# Patient Record
Sex: Male | Born: 1990 | Race: White | Hispanic: No | Marital: Single | Smoking: Never smoker
Health system: Southern US, Community
[De-identification: ages and names within clinical notes are randomized; demographics above are authoritative.]

## PROBLEM LIST (undated history)

## (undated) HISTORY — PX: ANTERIOR CRUCIATE LIGAMENT REPAIR: SHX115

---

## 2016-03-25 ENCOUNTER — Emergency Department: Payer: Self-pay

## 2016-03-25 ENCOUNTER — Emergency Department
Admission: EM | Admit: 2016-03-25 | Discharge: 2016-03-25 | Disposition: A | Discharge status: Home / Self Care | Payer: Self-pay | Attending: Emergency Medicine | Admitting: Emergency Medicine

## 2016-03-25 DIAGNOSIS — Y999 Unspecified external cause status: Secondary | ICD-10-CM | POA: Insufficient documentation

## 2016-03-25 DIAGNOSIS — Y939 Activity, unspecified: Secondary | ICD-10-CM | POA: Insufficient documentation

## 2016-03-25 DIAGNOSIS — W172XXA Fall into hole, initial encounter: Secondary | ICD-10-CM | POA: Insufficient documentation

## 2016-03-25 DIAGNOSIS — Y929 Unspecified place or not applicable: Secondary | ICD-10-CM | POA: Insufficient documentation

## 2016-03-25 DIAGNOSIS — S96912A Strain of unspecified muscle and tendon at ankle and foot level, left foot, initial encounter: Secondary | ICD-10-CM | POA: Insufficient documentation

## 2016-03-25 MED ORDER — NAPROXEN 500 MG PO TABS: 500.0000 mg | ORAL_TABLET | Freq: Two times a day (BID) | ORAL | Status: DC

## 2016-03-25 NOTE — ED Notes (Signed)
Pt states that he stepped in a hole and twisted his left ankle, pt states each time he puts weight on it he has pain in the ankle radiating down into the left foot

## 2016-03-25 NOTE — Discharge Instructions (Signed)
Cryotherapy Cryotherapy is when you put ice on your injury. Ice helps lessen pain and puffiness (swelling) after an injury. Ice works the best when you start using it in the first 24 to 48 hours after an injury. HOME CARE  Put a dry or damp towel between the ice pack and your skin.  You may press gently on the ice pack.  Leave the ice on for no more than 10 to 20 minutes at a time.  Check your skin after 5 minutes to make sure your skin is okay.  Rest at least 20 minutes between ice pack uses.  Stop using ice when your skin loses feeling (numbness).  Do not use ice on someone who cannot tell you when it hurts. This includes small children and people with memory problems (dementia). GET HELP RIGHT AWAY IF:  You have white spots on your skin.  Your skin turns blue or pale.  Your skin feels waxy or hard.  Your puffiness gets worse. MAKE SURE YOU:   Understand these instructions.  Will watch your condition.  Will get help right away if you are not doing well or get worse.   This information is not intended to replace advice given to you by your health care provider. Make sure you discuss any questions you have with your health care provider.   Document Released: 02/07/2008 Document Revised: 11/13/2011 Document Reviewed: 04/13/2011 Elsevier Interactive Patient Education 2016 [employer]  Ankle Sprain An ankle sprain is an injury to the strong, fibrous tissues (ligaments) that hold your ankle bones together.  HOME CARE   Put ice on your ankle for 1-2 days or as told by your doctor.  Put ice in a plastic bag.  Place a towel between your skin and the bag.  Leave the ice on for 15-20 minutes at a time, every 2 hours while you are awake.  Only take medicine as told by your doctor.  Raise (elevate) your injured ankle above the level of your heart as much as possible for 2-3 days.  Use crutches if your doctor tells you to. Slowly put your own weight on the affected  ankle. Use the crutches until you can walk without pain.  If you have a plaster splint:  Do not rest it on anything harder than a pillow for 24 hours.  Do not put weight on it.  Do not get it wet.  Take it off to shower or bathe.  If given, use an elastic wrap or support stocking for support. Take the wrap off if your toes lose feeling (numb), tingle, or turn cold or blue.  If you have an air splint:  Add or let out air to make it comfortable.  Take it off at night and to shower and bathe.  Wiggle your toes and move your ankle up and down often while you are wearing it. GET HELP IF:  You have rapidly increasing bruising or puffiness (swelling).  Your toes feel very cold.  You lose feeling in your foot.  Your medicine does not help your pain. GET HELP RIGHT AWAY IF:   Your toes lose feeling (numb) or turn blue.  You have severe pain that is increasing. MAKE SURE YOU:   Understand these instructions.  Will watch your condition.  Will get help right away if you are not doing well or get worse.   This information is not intended to replace advice given to you by your health care provider. Make sure you discuss any  questions you have with your health care provider.   Document Released: 02/07/2008 Document Revised: 09/11/2014 Document Reviewed: 03/04/2012 Elsevier Interactive Patient Education 2016 Else[employer] Inc.  Adult nurse and RICE WHAT DOES AN ELASTIC BANDAGE DO? Elastic bandages come in different shapes and sizes. They generally provide support to your injury and reduce swelling while you are healing, but they can perform different functions. Your health care provider will help you to decide what is best for your protection, recovery, or rehabilitation following an injury. WHAT ARE SOME GENERAL TIPS FOR USING AN ELASTIC BANDAGE?  Use the bandage as directed by the maker of the bandage that you are using.  Do not wrap the bandage too tightly. This may cut off  the circulation in the arm or leg in the area below the bandage.  If part of your body beyond the bandage becomes blue, numb, cold, swollen, or is more painful, your bandage is most likely too tight. If this occurs, remove your bandage and reapply it more loosely.  See your health care provider if the bandage seems to be making your problems worse rather than better.  An elastic bandage should be removed and reapplied every 3-4 hours or as directed by your health care provider. WHAT IS RICE? The routine care of many injuries includes rest, ice, compression, and elevation (RICE therapy).  Rest Rest is required to allow your body to heal. Generally, you can resume your routine activities when you are comfortable and have been given permission by your health care provider. Ice Icing your injury helps to keep the swelling down and it reduces pain. Do not apply ice directly to your skin.  Put ice in a plastic bag.  Place a towel between your skin and the bag.  Leave the ice on for 20 minutes, 2-3 times per day. Do this for as long as you are directed by your health care provider. Compression Compression helps to keep swelling down, gives support, and helps with discomfort. Compression may be done with an elastic bandage. Elevation Elevation helps to reduce swelling and it decreases pain. If possible, your injured area should be placed at or above the level of your heart or the center of your chest. WHEN SHOULD I SEEK MEDICAL CARE? You should seek medical care if:  You have persistent pain and swelling.  Your symptoms are getting worse rather than improving. These symptoms may indicate that further evaluation or further X-rays are needed. Sometimes, X-rays may not show a small broken bone (fracture) until a number of days later. Make a follow-up appointment with your health care provider. Ask when your X-ray results will be ready. Make sure that you get your X-ray results. WHEN SHOULD I SEEK  IMMEDIATE MEDICAL CARE? You should seek immediate medical care if:  You have a sudden onset of severe pain at or below the area of your injury.  You develop redness or increased swelling around your injury.  You have tingling or numbness at or below the area of your injury that does not improve after you remove the elastic bandage.   This information is not intended to replace advice given to you by your health care provider. Make sure you discuss any questions you have with your health care provider.   Document Released: 02/10/2002 Document Revised: 05/12/2015 Document Reviewed: 04/06/2014 Elsevier Interactive Patient Education 2016 [employer] Inc.   Foot Sprain  A foot sprain is an injury to one of the strong bands of tissue (ligaments) that connect and support  the many bones in your feet. The ligament can be stretched too much or it can tear. A tear can be either partial or complete. The severity of the sprain depends on how much of the ligament was damaged or torn.  CAUSES  A foot sprain is usually caused by suddenly twisting or pivoting your foot.  RISK FACTORS  This injury is more likely to occur in people who:  Play a sport, such as basketball or football.  Exercise or play a sport without warming up.  Start a new workout or sport.  Suddenly increase how long or hard they exercise or play a sport. SYMPTOMS  Symptoms of this condition start soon after an injury and include:  Pain, especially in the arch of the foot.  Bruising.  Swelling.  Inability to walk or use the foot to support body weight. DIAGNOSIS  This condition is diagnosed with a medical history and physical exam. You may also have imaging tests, such as:  X-rays to make sure there are no broken bones (fractures).  MRI to see if the ligament has torn. TREATMENT  Treatment varies depending on the severity of your sprain. Mild sprains can be treated with rest, ice, compression, and elevation (RICE). If your ligament  is overstretched or partially torn, treatment usually involves keeping your foot in a fixed position (immobilization) for a period of time. To help you do this, your health care provider will apply a bandage, splint, or walking boot to keep your foot from moving until it heals. You may also be advised to use crutches or a scooter for a few weeks to avoid bearing weight on your foot while it is healing.  If your ligament is fully torn, you may need surgery to reconnect the ligament to the bone. After surgery, a cast or splint will be applied and will need to stay on your foot while it heals.  Your health care provider may also suggest exercises or physical therapy to strengthen your foot.  HOME CARE INSTRUCTIONS  If You Have a Bandage, Splint, or Walking Boot:  Wear it as directed by your health care provider. Remove it only as directed by your health care provider.  Loosen the bandage, splint, or walking boot if your toes become numb and tingle, or if they turn cold and blue. Bathing  If your health care provider approves bathing and showering, cover the bandage or splint with a watertight plastic bag to protect it from water. Do not let the bandage or splint get wet. Managing Pain, Stiffness, and Swelling  If directed, apply ice to the injured area:  Put ice in a plastic bag.  Place a towel between your skin and the bag.  Leave the ice on for 20 minutes, 2-3 times per day. Move your toes often to avoid stiffness and to lessen swelling.  Raise (elevate) the injured area above the level of your heart while you are sitting or lying down. Driving  Do not drive or operate heavy machinery while taking pain medicine.  Do not drive while wearing a bandage, splint, or walking boot on a foot that you use for driving. Activity  Rest as directed by your health care provider.  Do not use the injured foot to support your body weight until your health care provider says that you can. Use crutches or other  supportive devices as directed by your health care provider.  Ask your health care provider what activities are safe for you. Gradually increase how much  and how far you walk until your health care provider says it is safe to return to full activity.  Do any exercise or physical therapy as directed by your health care provider. General Instructions  If a splint was applied, do not put pressure on any part of it until it is fully hardened. This may take several hours.  Take medicines only as directed by your health care provider. These include over-the-counter medicines and prescription medicines.  Keep all follow-up visits as directed by your health care provider. This is important.  When you can walk without pain, wear supportive shoes that have stiff soles. Do not wear flip-flops, and do not walk barefoot. SEEK MEDICAL CARE IF:  Your pain is not controlled with medicine.  Your bruising or swelling gets worse or does not get better with treatment.  Your splint or walking boot is damaged. SEEK IMMEDIATE MEDICAL CARE IF:  Your foot is numb or blue.  Your foot feels colder than normal. This information is not intended to replace advice given to you by your health care provider. Make sure you discuss any questions you have with your health care provider.  Document Released: 02/10/2002 Document Revised: 01/05/2015 Document Reviewed: 06/24/2014  Elsevier Interactive Patient Education [employer]

## 2016-03-25 NOTE — ED Provider Notes (Signed)
[facility] Emergency Department Provider Note  ____________________________________________  Time seen: Approximately 5:43 PM  I have reviewed the triage vital signs and the nursing notes.   HISTORY  Chief Complaint Ankle Pain    HPI RUSELL MENEELY is a 25 y.o. male , NAD, presents to the emergency department with several hour history of left ankle and foot pain. States he stepped in a hole and twisted the left ankle and foot. Notes that when he bears weight has pain in the ankle radiating into the left foot. Denies any open wounds or lacerations. Does have a swollen area about the left foot. Denies any numbness, weakness, tingling.   No past medical history on file.  There are no active problems to display for this patient.   No past surgical history on file.  Current Outpatient Rx  Name  Route  Sig  Dispense  Refill  . naproxen (NAPROSYN) 500 MG tablet   Oral   Take 1 tablet (500 mg total) by mouth 2 (two) times daily with a meal.   14 tablet   0     Allergies Review of patient's allergies indicates no known allergies.  No family history on file.  Social History Social History  Substance Use Topics  . Smoking status: Not on file  . Smokeless tobacco: Not on file  . Alcohol Use: Not on file     Review of Systems  Constitutional: No fatigue Cardiovascular: No chest pain. Respiratory:  No shortness of breath. Musculoskeletal: Positive left foot and ankle pain.  Skin: Positive swelling left foot. Negative for rash. Neurological: Negative for headaches, focal weakness or numbness. No tingling. 10-point ROS otherwise negative.  ____________________________________________   PHYSICAL EXAM:  VITAL SIGNS: ED Triage Vitals  Enc Vitals Group     BP 03/25/16 1644 133/91 mmHg     Pulse Rate 03/25/16 1644 75     Resp 03/25/16 1644 18     Temp 03/25/16 1644 98.7 F (37.1 C)     Temp Source 03/25/16 1644 Oral     SpO2 03/25/16 1644  99 %     Weight 03/25/16 1644 184 lb (83.462 kg)     Height 03/25/16 1644  (1.778 m)     Head Cir --      Peak Flow --      Pain Score 03/25/16 1645 8     Pain Loc --      Pain Edu? --      Excl. in GC? --      Constitutional: Alert and oriented. Well appearing and in no acute distress. Eyes: Conjunctivae are normal.  Head: Atraumatic. Cardiovascular:  Good peripheral circulation with 2+ pulses noted in the left lower extremity. Capillary refill is brisk in all digits of the left foot. Respiratory: Normal respiratory effort without tachypnea or retractions.  Musculoskeletal: Full range of motion of the left ankle without pain. No anterior or posterior drawer laxity. No varus or valgus stress laxity. No pain with eversion or inversion. No lower extremity tenderness nor edema.  No joint effusions. Neurologic:  Normal speech and language. No gross focal neurologic deficits are appreciated.  Skin:  3 cm oblong area of soft tissue swelling noted about the anterior ankle/proximal fifth metatarsal region. Mild tenderness to palpation. No fluctuant masses noted below. No open wounds or lacerations. No significant ecchymosis. Skin is warm, dry and intact. No rash noted. Psychiatric: Mood and affect are normal. Speech and behavior are normal. Patient exhibits appropriate  insight and judgement.   ____________________________________________   LABS  None ____________________________________________  EKG  None ____________________________________________  RADIOLOGY I have personally viewed and evaluated these images (plain radiographs) as part of my medical decision making, as well as reviewing the written report by the radiologist.  Dg Ankle Complete Left  03/25/2016  CLINICAL DATA:  Twisting injury left ankle due to stepping in a hole today. Pain. Initial encounter. EXAM: LEFT ANKLE COMPLETE - 3+ VIEW COMPARISON:  None. FINDINGS: There is no evidence of fracture, dislocation, or  joint effusion. There is no evidence of arthropathy or other focal bone abnormality. Soft tissues are unremarkable. IMPRESSION: Negative exam. Electronically Signed   By: Drusilla Kanner M.D.   On: 03/25/2016 17:15    ____________________________________________    PROCEDURES  Procedure(s) performed: None   Medications - No data to display   ____________________________________________   INITIAL IMPRESSION / ASSESSMENT AND PLAN / ED COURSE  Pertinent imaging results that were available during my care of the patient were reviewed by me and considered in my medical decision making (see chart for details).  Patient's diagnosis is consistent with strain of left foot. Patient was placed in Ace wrap and given crutches for supportive care. Patient will be discharged home with prescriptions for naproxen to take as directed. Should apply ice to the affected area 20 minutes 3-4 times daily as needed. May keep left lower extremity elevated when not ambulating to decrease swelling. Patient is to follow up with Dr. Martha Clan in orthopedics or his primary care provider if symptoms persist past this treatment course. Patient is given ED precautions to return to the ED for any worsening or new symptoms.      ____________________________________________  FINAL CLINICAL IMPRESSION(S) / ED DIAGNOSES  Final diagnoses:  Strain of left foot, initial encounter      NEW MEDICATIONS STARTED DURING THIS VISIT:  New Prescriptions   NAPROXEN (NAPROSYN) 500 MG TABLET    Take 1 tablet (500 mg total) by mouth 2 (two) times daily with a meal.         Hope Pigeon, PA-C 03/25/16 1805  Governor Rooks, MD 03/25/16 1930

## 2019-01-08 ENCOUNTER — Other Ambulatory Visit: Payer: Self-pay

## 2019-01-08 ENCOUNTER — Ambulatory Visit
Admission: EM | Admit: 2019-01-08 | Discharge: 2019-01-08 | Disposition: A | Discharge status: Home / Self Care | Payer: BLUE CROSS/BLUE SHIELD | Attending: Family Medicine | Admitting: Family Medicine

## 2019-01-08 ENCOUNTER — Encounter: Payer: Self-pay | Admitting: Emergency Medicine

## 2019-01-08 DIAGNOSIS — R1031 Right lower quadrant pain: Secondary | ICD-10-CM

## 2019-01-08 LAB — URINALYSIS, COMPLETE (UACMP) WITH MICROSCOPIC
Bacteria, UA: NONE SEEN
Bilirubin Urine: NEGATIVE
Glucose, UA: NEGATIVE mg/dL
Hgb urine dipstick: NEGATIVE
Ketones, ur: NEGATIVE mg/dL
Leukocytes,Ua: NEGATIVE
Nitrite: NEGATIVE
Protein, ur: NEGATIVE mg/dL
Specific Gravity, Urine: 1.02 (ref 1.005–1.030)
pH: 7 (ref 5.0–8.0)

## 2019-01-08 LAB — COMPREHENSIVE METABOLIC PANEL
ALT: 44 U/L (ref 0–44)
AST: 29 U/L (ref 15–41)
Albumin: 5 g/dL (ref 3.5–5.0)
Alkaline Phosphatase: 61 U/L (ref 38–126)
Anion gap: 9 (ref 5–15)
BUN: 14 mg/dL (ref 6–20)
CO2: 22 mmol/L (ref 22–32)
Calcium: 9 mg/dL (ref 8.9–10.3)
Chloride: 104 mmol/L (ref 98–111)
Creatinine, Ser: 1.04 mg/dL (ref 0.61–1.24)
GFR calc Af Amer: >60 mL/min (ref >60)
GFR calc non Af Amer: >60 mL/min (ref >60)
Glucose, Bld: 106 mg/dL — ABNORMAL HIGH (ref 70–99)
Potassium: 3.9 mmol/L (ref 3.5–5.1)
Sodium: 135 mmol/L (ref 135–145)
Total Bilirubin: 0.8 mg/dL (ref 0.3–1.2)
Total Protein: 7.9 g/dL (ref 6.5–8.1)

## 2019-01-08 LAB — CBC WITH DIFFERENTIAL/PLATELET
Abs Immature Granulocytes: 0.02 10*3/uL (ref 0.00–0.07)
Basophils Absolute: 0.1 10*3/uL (ref 0.0–0.1)
Basophils Relative: 1 %
Eosinophils Absolute: 0.3 10*3/uL (ref 0.0–0.5)
Eosinophils Relative: 6 %
HCT: 46.3 % (ref 39.0–52.0)
Hemoglobin: 16.3 g/dL (ref 13.0–17.0)
Immature Granulocytes: 0 %
Lymphocytes Relative: 23 %
Lymphs Abs: 1.2 10*3/uL (ref 0.7–4.0)
MCH: 31.8 pg (ref 26.0–34.0)
MCHC: 35.2 g/dL (ref 30.0–36.0)
MCV: 90.3 fL (ref 80.0–100.0)
Monocytes Absolute: 0.5 10*3/uL (ref 0.1–1.0)
Monocytes Relative: 9 %
Neutro Abs: 3.1 10*3/uL (ref 1.7–7.7)
Neutrophils Relative %: 61 %
Platelets: 246 10*3/uL (ref 150–400)
RBC: 5.13 MIL/uL (ref 4.22–5.81)
RDW: 12.4 % (ref 11.5–15.5)
WBC: 5.1 10*3/uL (ref 4.0–10.5)
nRBC: 0 % (ref 0.0–0.2)

## 2019-01-08 LAB — LIPASE, BLOOD: Lipase: 28 U/L (ref 11–51)

## 2019-01-08 MED ORDER — DICYCLOMINE HCL 20 MG PO TABS: 20.0000 mg | ORAL_TABLET | Freq: Four times a day (QID) | ORAL | 0 refills | Status: DC | PRN

## 2019-01-08 NOTE — Discharge Instructions (Addendum)
Labs normal.  Medication as directed.  If persists, I recommend CT imaging.  Take care  Dr. Adriana Simas

## 2019-01-08 NOTE — ED Provider Notes (Signed)
[facility]    CSN: [id number] Arrival date & time: 01/08/19  0855  History   Chief Complaint Chief Complaint  Patient presents with  . Abdominal Pain    HPI  28 year old male presents with abdominal pain.  Patient states that his symptoms started on Monday.  Patient reports that he developed gas and diarrhea.  Developed abdominal pain on Thursday.  Seen in a local urgent care on Friday.  Was advised to take Imodium and use Gas-X. Patient states that his gas and diarrhea is now resolved.  However, he continues to have abdominal pain.  He reports right lower quadrant pain.  Described as dull.  Currently 2/10 in severity.  Has not worsened or improved since Thursday.  No fever.  Denies constipation.  No other symptoms.  Patient states that he is eating normally.  Patient states that he drinks alcohol.  Had 3 beers last night.  No other associated symptoms.  No known exacerbating factors.  No other complaints.  History reviewed and updated as below. PMH: ACL tear  Past Surgical History:  Procedure Laterality Date  . ANTERIOR CRUCIATE LIGAMENT REPAIR     Home Medications    Prior to Admission medications   Medication Sig Start Date End Date Taking? Authorizing Provider  dicyclomine (BENTYL) 20 MG tablet Take 1 tablet (20 mg total) by mouth 4 (four) times daily as needed (Abdominal pain). 01/08/19   Tommie Sams, DO   Social History Social History   Tobacco Use  . Smoking status: Never Smoker  . Smokeless tobacco: Never Used  Substance Use Topics  . Alcohol use: Yes  . Drug use: Never     Allergies   Patient has no known allergies.   Review of Systems Review of Systems  Constitutional: Negative for fever.  Gastrointestinal: Positive for abdominal pain and diarrhea. Negative for constipation and vomiting.   Physical Exam Triage Vital Signs ED Triage Vitals  Enc Vitals Group     BP 01/08/19 0911 (!) 153/77     Pulse Rate 01/08/19 0911 80     Resp 01/08/19  0911 18     Temp 01/08/19 0911 98.4 F (36.9 C)     Temp Source 01/08/19 0911 Oral     SpO2 01/08/19 0911 100 %     Weight 01/08/19 0912 195 lb (88.5 kg)     Height 01/08/19 0912 5\' 9"  (1.753 m)     Head Circumference --      Peak Flow --      Pain Score 01/08/19 0912 2     Pain Loc --      Pain Edu? --      Excl. in GC? --    Updated Vital Signs BP (!) 153/77 (BP Location: Right Arm)   Pulse 80   Temp 98.4 F (36.9 C) (Oral)   Resp 18   Ht 5\' 9"  (1.753 m)   Wt 88.5 kg   SpO2 100%   BMI 28.80 kg/m   Visual Acuity Right Eye Distance:   Left Eye Distance:   Bilateral Distance:    Right Eye Near:   Left Eye Near:    Bilateral Near:     Physical Exam Vitals signs and nursing note reviewed.  Constitutional:      General: He is not in acute distress.    Appearance: Normal appearance.  HENT:     Head: Normocephalic and atraumatic.  Eyes:     General: No scleral icterus.  Right eye: No discharge.        Left eye: No discharge.     Conjunctiva/sclera: Conjunctivae normal.  Cardiovascular:     Rate and Rhythm: Normal rate and regular rhythm.  Pulmonary:     Effort: Pulmonary effort is normal.     Breath sounds: Normal breath sounds. No wheezing or rales.  Abdominal:     General: There is no distension.     Palpations: Abdomen is soft.     Tenderness: There is no abdominal tenderness.  Neurological:     Mental Status: He is alert.  Psychiatric:        Mood and Affect: Mood normal.        Behavior: Behavior normal.    UC Treatments / Results  Labs (all labs ordered are listed, but only abnormal results are displayed) Labs Reviewed  URINALYSIS, COMPLETE (UACMP) WITH MICROSCOPIC - Abnormal; Notable for the following components:      Result Value   Color, Urine AMBER (*)    All other components within normal limits  COMPREHENSIVE METABOLIC PANEL - Abnormal; Notable for the following components:   Glucose, Bld 106 (*)    All other components within normal  limits  CBC WITH DIFFERENTIAL/PLATELET  LIPASE, BLOOD    EKG None  Radiology No results found.  Procedures Procedures (including critical care time)  Medications Ordered in UC Medications - No data to display  Initial Impression / Assessment and Plan / UC Course  I have reviewed the triage vital signs and the nursing notes.  Pertinent labs & imaging results that were available during my care of the patient were reviewed by me and considered in my medical decision making (see chart for details).    28 year old male presents with right lower quadrant pain.  His exam is benign.  He had no discrete tenderness on exam.  I do not clinically suspect appendicitis given his clinical picture and exam.  Laboratory studies reassuring.  Dicyclomine as directed.  If persists, will need further work-up with imaging.  Final Clinical Impressions(s) / UC Diagnoses   Final diagnoses:  RLQ abdominal pain     Discharge Instructions     Labs normal.  Medication as directed.  If persists, I recommend CT imaging.  Take care  Dr. Adriana Simasook     ED Prescriptions    Medication Sig Dispense Auth. Provider   dicyclomine (BENTYL) 20 MG tablet Take 1 tablet (20 mg total) by mouth 4 (four) times daily as needed (Abdominal pain). 30 tablet Tommie Samsook, Geoge Lawrance G, DO     Controlled Substance Prescriptions [place] Controlled Substance Registry consulted? Not Applicable   Tommie SamsCook, Abanoub Hanken G, DO 01/08/19 1010

## 2019-01-08 NOTE — ED Triage Notes (Signed)
Patient c/o RLQ abd pain and diarrhea that started 6 days ago. He was seen at an urgent care last week and was given instructions for OTC Imodium and gas x for his symptoms. Diarrhea has resolved but he continues to have the abdominal pain.

## 2020-12-30 ENCOUNTER — Other Ambulatory Visit: Payer: Self-pay

## 2020-12-30 ENCOUNTER — Ambulatory Visit
Admission: RE | Admit: 2020-12-30 | Discharge: 2020-12-30 | Disposition: A | Discharge status: Home / Self Care | Payer: BC Managed Care – PPO | Source: Ambulatory Visit | Attending: Family Medicine | Admitting: Family Medicine

## 2020-12-30 VITALS — BP 133/73 | HR 69 | Temp 98.4°F | Resp 18 | Ht 69.0 in | Wt 195.1 lb

## 2020-12-30 DIAGNOSIS — R519 Headache, unspecified: Secondary | ICD-10-CM | POA: Diagnosis not present

## 2020-12-30 MED ORDER — CETIRIZINE-PSEUDOEPHEDRINE ER 5-120 MG PO TB12: 1.0000 | ORAL_TABLET | Freq: Two times a day (BID) | ORAL | 0 refills | Status: AC

## 2020-12-30 MED ORDER — BUTALBITAL-APAP-CAFFEINE 50-325-40 MG PO TABS: 1.0000 | ORAL_TABLET | Freq: Four times a day (QID) | ORAL | 0 refills | Status: AC | PRN

## 2020-12-30 NOTE — Discharge Instructions (Signed)
Medication as prescribed.  If persists, recommend that you see Neurology.  Take care  Dr. Adriana Simas

## 2020-12-30 NOTE — ED Provider Notes (Signed)
[facility]    CSN: [id number] Arrival date & time: 12/30/20  1242      History   Chief Complaint Chief Complaint  Patient presents with  . Headache   HPI  30 year old male presents with headache.  Started on Monday.  Located behind the left eye.  He denies respiratory symptoms.  No nausea or vomiting.  No photophobia.  No phonophobia.  He reports some mild blurry vision at times in his left eye.  No vision loss.  Pain is 4/10 in severity.  Has not responded to ibuprofen.  He states that he does have some seasonal allergies.  No other reported symptoms.  No other complaints.   Past Surgical History:  Procedure Laterality Date  . ANTERIOR CRUCIATE LIGAMENT REPAIR     Home Medications    Prior to Admission medications   Medication Sig Start Date End Date Taking? Authorizing Provider  butalbital-acetaminophen-caffeine (FIORICET) 50-325-40 MG tablet Take 1 tablet by mouth every 6 (six) hours as needed for headache. 12/30/20 12/30/21 Yes Curlee Bogan G, DO  cetirizine-pseudoephedrine (ZYRTEC-D) 5-120 MG tablet Take 1 tablet by mouth 2 (two) times daily. 12/30/20  Yes Courtland Reas G, DO  dicyclomine (BENTYL) 20 MG tablet Take 1 tablet (20 mg total) by mouth 4 (four) times daily as needed (Abdominal pain). 01/08/19 12/30/20  Tommie Sams, DO   Social History Social History   Tobacco Use  . Smoking status: Never Smoker  . Smokeless tobacco: Never Used  Vaping Use  . Vaping Use: Never used  Substance Use Topics  . Alcohol use: Yes  . Drug use: Never     Allergies   Patient has no known allergies.   Review of Systems Review of Systems  Constitutional: Negative.   Neurological: Positive for headaches.   Physical Exam Triage Vital Signs ED Triage Vitals  Enc Vitals Group     BP 12/30/20 1311 (!) 163/87     Pulse Rate 12/30/20 1311 69     Resp 12/30/20 1311 18     Temp 12/30/20 1311 98.4 F (36.9 C)     Temp Source 12/30/20 1311 Oral     SpO2 12/30/20 1311  100 %     Weight 12/30/20 1308 195 lb 1.7 oz (88.5 kg)     Height 12/30/20 1308 5\' 9"  (1.753 m)     Head Circumference --      Peak Flow --      Pain Score 12/30/20 1308 4     Pain Loc --      Pain Edu? --      Excl. in GC? --    Updated Vital Signs BP 133/73 (BP Location: Right Arm)   Pulse 69   Temp 98.4 F (36.9 C) (Oral)   Resp 18   Ht 5\' 9"  (1.753 m)   Wt 88.5 kg   SpO2 100%   BMI 28.81 kg/m   Visual Acuity Right Eye Distance:   Left Eye Distance:   Bilateral Distance:    Right Eye Near:   Left Eye Near:    Bilateral Near:     Physical Exam Vitals and nursing note reviewed.  Constitutional:      General: He is not in acute distress.    Appearance: Normal appearance. He is not ill-appearing.  HENT:     Head: Normocephalic and atraumatic.  Eyes:     Conjunctiva/sclera: Conjunctivae normal.     Pupils: Pupils are equal, round, and reactive to light.  Cardiovascular:  Rate and Rhythm: Normal rate and regular rhythm.     Heart sounds: No murmur heard.   Pulmonary:     Effort: Pulmonary effort is normal.     Breath sounds: Normal breath sounds. No wheezing, rhonchi or rales.  Neurological:     Mental Status: He is alert.  Psychiatric:        Mood and Affect: Mood normal.        Behavior: Behavior normal.    UC Treatments / Results  Labs (all labs ordered are listed, but only abnormal results are displayed) Labs Reviewed - No data to display  EKG   Radiology No results found.  Procedures Procedures (including critical care time)  Medications Ordered in UC Medications - No data to display  Initial Impression / Assessment and Plan / UC Course  I have reviewed the triage vital signs and the nursing notes.  Pertinent labs & imaging results that were available during my care of the patient were reviewed by me and considered in my medical decision making (see chart for details).    30 year old male presents with acute headache.  Etiology and  prognosis unclear at this time.  History not consistent with migraine, tension, cluster headache.  Could be sinus related although he has essentially no respiratory symptoms at this time.  Placing on Fioricet.  Zyrtec-D as prescribed.  If persist recommend seeing neurology.  Final Clinical Impressions(s) / UC Diagnoses   Final diagnoses:  Acute intractable headache, unspecified headache type     Discharge Instructions     Medication as prescribed.  If persists, recommend that you see Neurology.  Take care  Dr. Adriana Simas   ED Prescriptions    Medication Sig Dispense Auth. Provider   butalbital-acetaminophen-caffeine (FIORICET) 50-325-40 MG tablet Take 1 tablet by mouth every 6 (six) hours as needed for headache. 30 tablet Jupiter Kabir G, DO   cetirizine-pseudoephedrine (ZYRTEC-D) 5-120 MG tablet Take 1 tablet by mouth 2 (two) times daily. 30 tablet Tommie Sams, DO     PDMP not reviewed this encounter.   Tommie Sams, [place] 12/30/20 1523

## 2020-12-30 NOTE — ED Triage Notes (Signed)
Patient c/o headache behind his left eye since Monday. He states the pain gets worse when he applies pressure to the top of his head. Reports some mild blurred vision.

## 2021-01-04 ENCOUNTER — Other Ambulatory Visit: Payer: Self-pay | Admitting: Family Medicine

## 2021-01-04 DIAGNOSIS — R2232 Localized swelling, mass and lump, left upper limb: Secondary | ICD-10-CM

## 2021-02-09 ENCOUNTER — Other Ambulatory Visit: Payer: Self-pay

## 2021-02-09 ENCOUNTER — Ambulatory Visit
Admission: RE | Admit: 2021-02-09 | Discharge: 2021-02-09 | Disposition: A | Discharge status: Home / Self Care | Payer: BC Managed Care – PPO | Source: Ambulatory Visit | Attending: Family Medicine | Admitting: Family Medicine

## 2021-02-09 DIAGNOSIS — R2232 Localized swelling, mass and lump, left upper limb: Secondary | ICD-10-CM | POA: Diagnosis not present

## 2022-04-19 IMAGING — US US EXTREM UP*L* LTD
1 series · 8 of 8 positions shown · non-contrast
Comparison: None.

CLINICAL DATA: Posterior shoulder lump.

EXAM:
ULTRASOUND LEFT UPPER EXTREMITY LIMITED
TECHNIQUE: Ultrasound examination of the upper extremity soft tissues was
performed in the area of clinical concern.

[Series 1: us extrem up*left* ltd · 0.07mm/px · 8 of 8 slices shown]
[im 1/8]
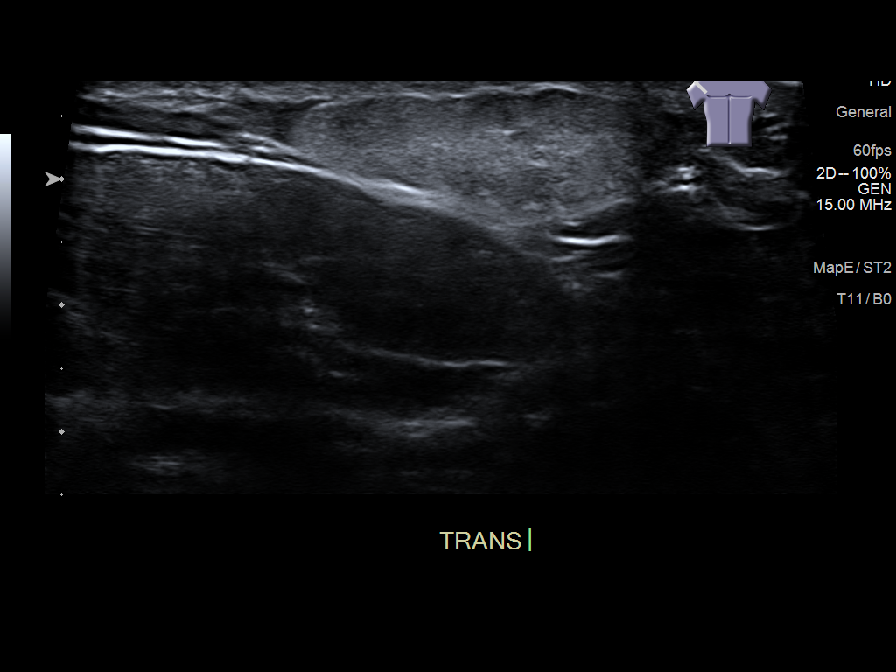
[im 2/8]
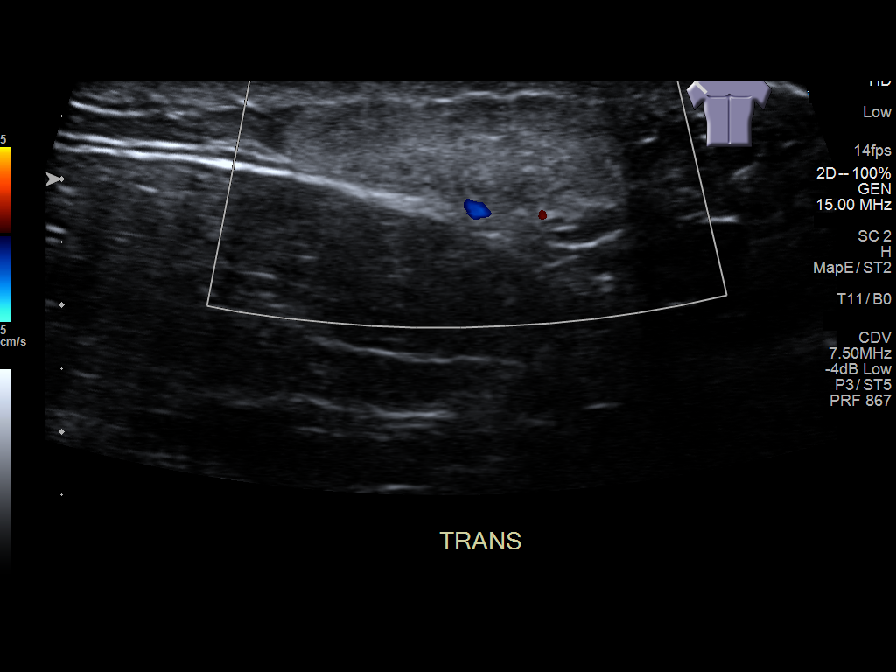
[im 3/8]
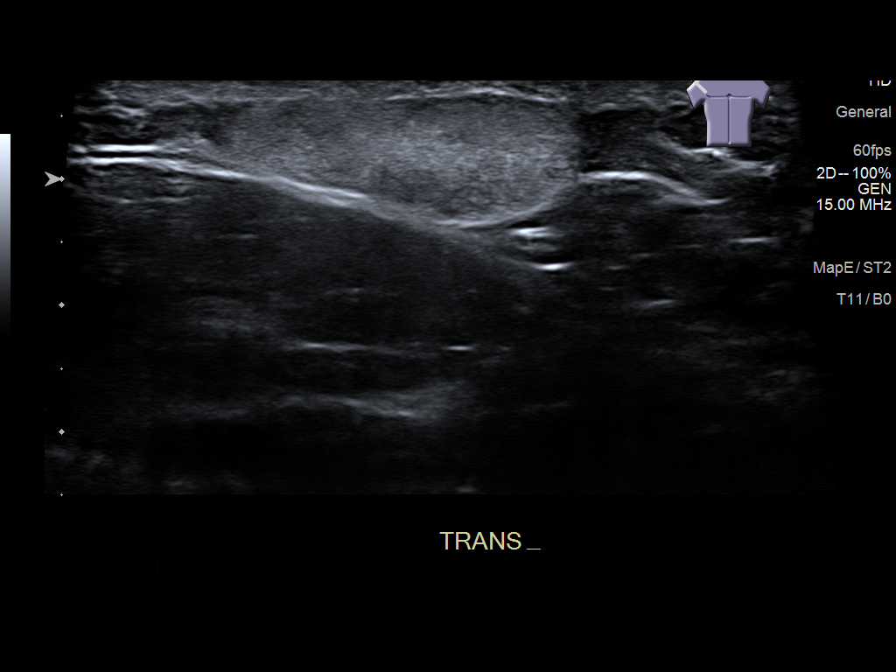
[im 4/8]
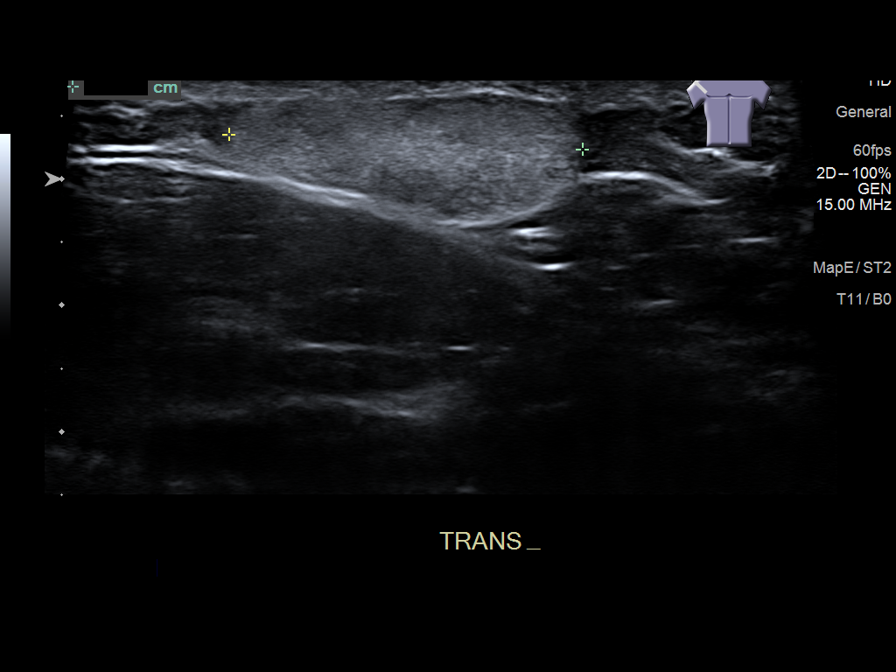
[im 5/8]
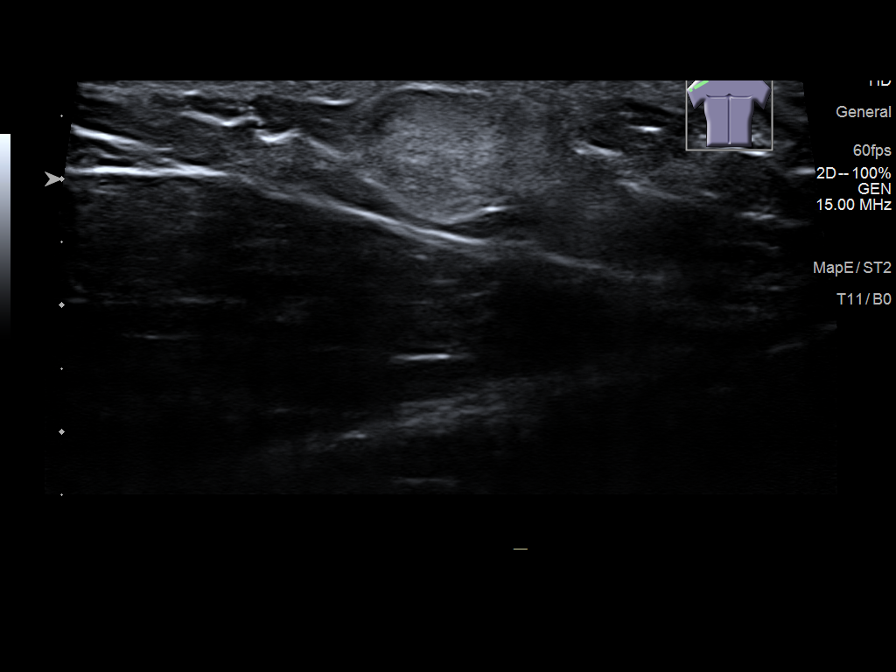
[im 6/8]
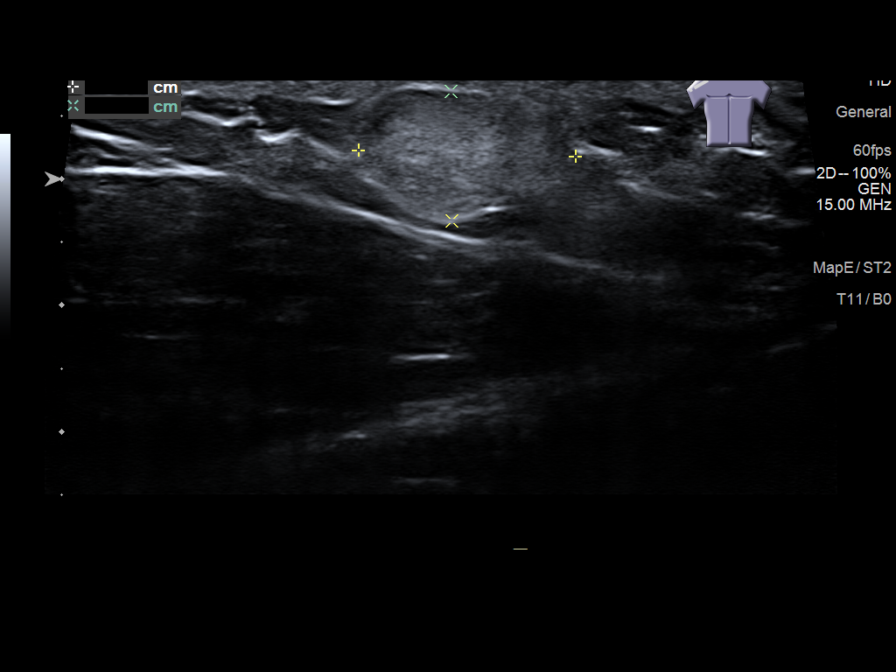
[im 7/8]
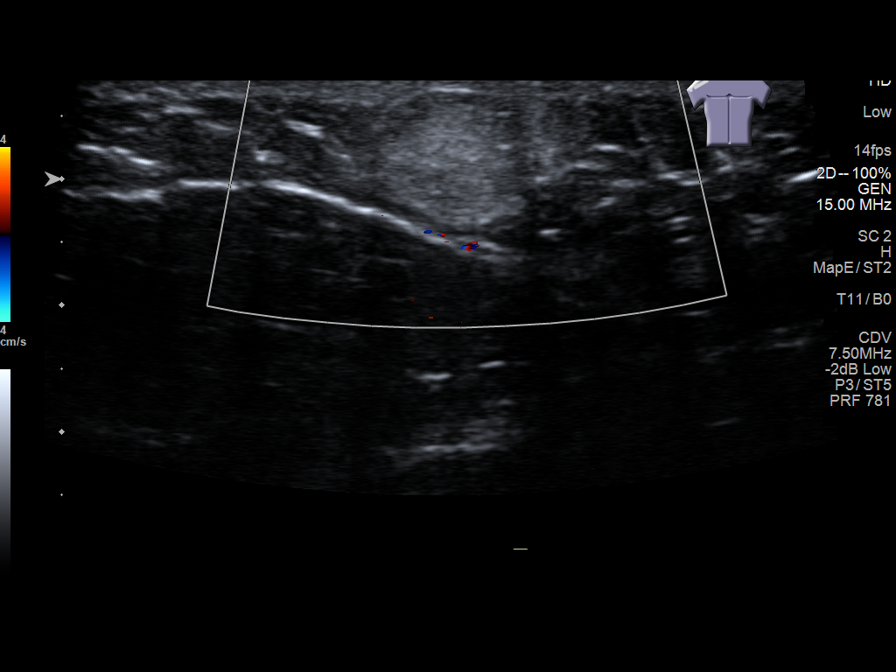
[im 8/8]
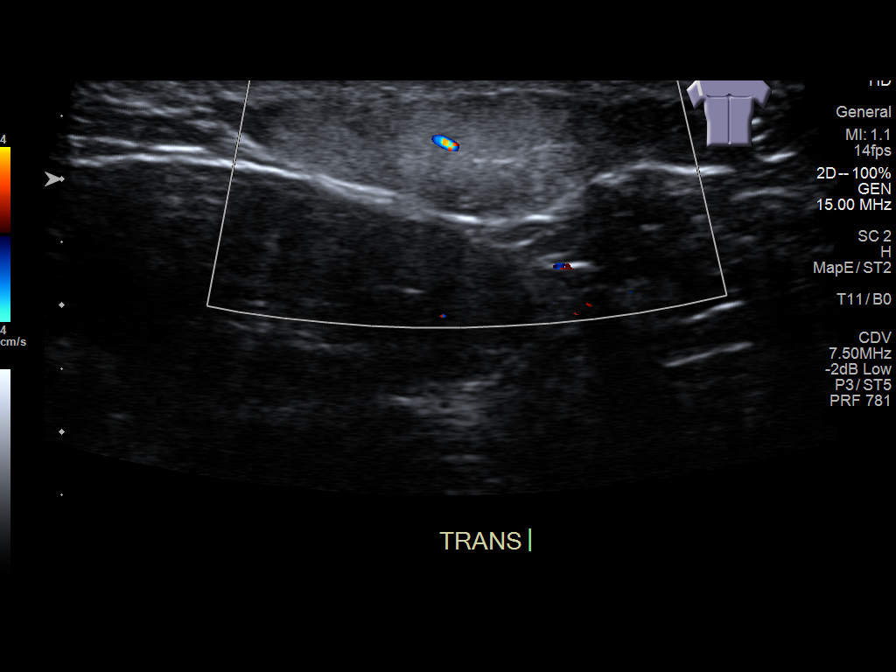

[8 of 8 positions shown; findings below may reference images not displayed]

FINDINGS: Focused ultrasound of the palpable abnormality over the posterior
left shoulder demonstrates a well-circumscribed subcutaneous 2.8 x
1.7 x 1.0 cm slightly hyperechoic mass with minimal internal
vascularity.
IMPRESSION: 1. Posterior left shoulder palpable abnormality corresponds to a
cm lipoma.

## 2023-03-16 ENCOUNTER — Ambulatory Visit: Payer: BC Managed Care – PPO

## 2023-03-16 ENCOUNTER — Ambulatory Visit
Admission: EM | Admit: 2023-03-16 | Discharge: 2023-03-16 | Disposition: A | Discharge status: Home / Self Care | Payer: BC Managed Care – PPO | Attending: Emergency Medicine | Admitting: Emergency Medicine

## 2023-03-16 DIAGNOSIS — S39011A Strain of muscle, fascia and tendon of abdomen, initial encounter: Secondary | ICD-10-CM | POA: Diagnosis present

## 2023-03-16 LAB — URINALYSIS, W/ REFLEX TO CULTURE (INFECTION SUSPECTED)
Bacteria, UA: NONE SEEN
Bilirubin Urine: NEGATIVE
Glucose, UA: NEGATIVE mg/dL
Hgb urine dipstick: NEGATIVE
Ketones, ur: NEGATIVE mg/dL
Leukocytes,Ua: NEGATIVE
Nitrite: NEGATIVE
Protein, ur: NEGATIVE mg/dL
RBC / HPF: NONE SEEN RBC/hpf (ref 0–5)
Specific Gravity, Urine: 1.015 (ref 1.005–1.030)
pH: 7 (ref 5.0–8.0)

## 2023-03-16 NOTE — Discharge Instructions (Addendum)
Your urinalysis and x-ray were unremarkable.  As we discussed, I do suspect that your pain is coming from the result of an abdominal muscle wall strain.  Purchase an abdominal binder from [employer], or medical supply store, and wear it to help provide support to your abdominal muscles and see if this helps alleviate some of your discomfort with movement.  You can take over-the-counter ibuprofen 600 mg (3 tablets), every 6 hours with food, as needed for pain and inflammation.  Avoid any heavy lifting, pulling, or straining.  If you have any increase in your abdominal pain, nausea vomiting, or you develop fever you need to go to the ER for evaluation.

## 2023-03-16 NOTE — ED Triage Notes (Signed)
Patient presents to UC for LLQ pain x couple hours ago. He took TUMS for symptom relief with no changes. Pain worse with movement.   Denies N/V, diarrhea, fever, urinary symptoms, or concern for STDs.

## 2023-03-16 NOTE — ED Provider Notes (Signed)
[facility]    CSN: [id number] Arrival date & time: 03/16/23  1428      History   Chief Complaint Chief Complaint  Patient presents with   Abdominal Pain    HPI Gilbert Hall is a 32 y.o. male.   HPI  32 year old male with history of ACL repair presents for evaluation of acute onset of left lower quadrant abdominal pain.  He reports that the pain began at work when he sat down to eat lunch.  He leaned forward and felt the pain.  When he leaned back the pain improved, but then returned when he sat forward again.  He states he did take Tums to see if it would relieve his symptoms and he reports that there was no improvement.  He describes the pain as a constant dull pain on the scale of 4/10 that will increase to a sharp cramp of 7/10 with movement.  He denies any fever, pain with urination or urinary urgency or frequency.  No blood in his urine.  Also no diarrhea, constipation, or blood in his stool.  No history of diverticulitis.  History reviewed. No pertinent past medical history.  There are no problems to display for this patient.   Past Surgical History:  Procedure Laterality Date   ANTERIOR CRUCIATE LIGAMENT REPAIR         Home Medications    Prior to Admission medications   Medication Sig Start Date End Date Taking? Authorizing Provider  cetirizine-pseudoephedrine (ZYRTEC-D) 5-120 MG tablet Take 1 tablet by mouth 2 (two) times daily. 12/30/20   Tommie Sams, DO  dicyclomine (BENTYL) 20 MG tablet Take 1 tablet (20 mg total) by mouth 4 (four) times daily as needed (Abdominal pain). 01/08/19 12/30/20  Tommie Sams, DO    Family History History reviewed. No pertinent family history.  Social History Social History   Tobacco Use   Smoking status: Never   Smokeless tobacco: Never  Vaping Use   Vaping status: Never Used  Substance Use Topics   Alcohol use: Yes   Drug use: Never     Allergies   Patient has no known allergies.   Review of  Systems Review of Systems  Constitutional:  Negative for fever.  Gastrointestinal:  Positive for abdominal pain. Negative for blood in stool, constipation, diarrhea, nausea and vomiting.     Physical Exam Triage Vital Signs ED Triage Vitals  Encounter Vitals Group     BP 03/16/23 1442 (!) 157/94     Systolic BP Percentile --      Diastolic BP Percentile --      Pulse Rate 03/16/23 1442 78     Resp 03/16/23 1442 16     Temp --      Temp Source 03/16/23 1442 Oral     SpO2 03/16/23 1442 98 %     Weight --      Height --      Head Circumference --      Peak Flow --      Pain Score 03/16/23 1441 4     Pain Loc --      Pain Education --      Exclude from Growth Chart --    No data found.  Updated Vital Signs BP (!) 157/94 (BP Location: Left Arm)   Pulse 78   Resp 16   SpO2 98%   Visual Acuity Right Eye Distance:   Left Eye Distance:   Bilateral Distance:    Right  Eye Near:   Left Eye Near:    Bilateral Near:     Physical Exam Vitals and nursing note reviewed.  Constitutional:      Appearance: Normal appearance. He is not ill-appearing.  HENT:     Head: Normocephalic and atraumatic.  Cardiovascular:     Rate and Rhythm: Normal rate and regular rhythm.     Pulses: Normal pulses.     Heart sounds: Normal heart sounds. No murmur heard.    No friction rub. No gallop.  Pulmonary:     Effort: Pulmonary effort is normal.     Breath sounds: Normal breath sounds. No wheezing, rhonchi or rales.  Abdominal:     General: Abdomen is flat.     Palpations: Abdomen is soft.     Tenderness: There is abdominal tenderness. There is no guarding or rebound.  Skin:    General: Skin is warm and dry.     Capillary Refill: Capillary refill takes less than 2 seconds.     Findings: No bruising or erythema.  Neurological:     General: No focal deficit present.     Mental Status: He is alert and oriented to person, place, and time.      UC Treatments / Results  Labs (all labs  ordered are listed, but only abnormal results are displayed) Labs Reviewed  URINALYSIS, W/ REFLEX TO CULTURE (INFECTION SUSPECTED) - Abnormal; Notable for the following components:      Result Value   Color, Urine STRAW (*)    All other components within normal limits    EKG   Radiology DG Abdomen 1 View  Result Date: 03/16/2023 CLINICAL DATA:  Left lower quadrant abdominal pain. EXAM: ABDOMEN - 1 VIEW COMPARISON:  None Available. FINDINGS: Scattered gas and stool throughout the colon. Gas-filled normal caliber appendix suggests absence of appendicitis. No small or large bowel distention. No radiopaque stones. Visualized bones and soft tissue contours appear intact. Visualized lung bases are clear. IMPRESSION: Normal nonobstructive bowel gas pattern. Electronically Signed   By: Burman Nieves M.D.   On: 03/16/2023 15:57    Procedures Procedures (including critical care time)  Medications Ordered in UC Medications - No data to display  Initial Impression / Assessment and Plan / UC Course  I have reviewed the triage vital signs and the nursing notes.  Pertinent labs & imaging results that were available during my care of the patient were reviewed by me and considered in my medical decision making (see chart for details).   Patient is a nontoxic-appearing 32 year old male presenting for evaluation of several hours worth of left lower quad abdominal pain as outlined in HPI above.  On exam patient's abdomen is soft with mild tenderness in the left lower quadrant.  No guarding or rebound noted.  No muscle defect in the abdominal wall noted.  There is also no visible erythema or ecchymosis.  Differential diagnosis includes constipation, kidney stone, diverticulitis, or muscle strain of the abdominal wall.  I will order urinalysis to evaluate for the presence of blood in the patient's urine along with a KUB to evaluate for constipation or possible renal stone.  Urinalysis is straw in color but  otherwise unremarkable.  Radiology impression of KUB states normal nonobstructive bowel gas pattern.  Gas-filled normal caliber appendix suggests absence of appendicitis.  The patient's left lower quad abdominal pain is not being caused by either constipation or a renal stone.  Given the fact the patient does not have a fever I am  less suspicious of diverticulitis.  I will treat the patient for domino wall strain with over-the-counter NSAIDs, rest, and I suggested that he purchase an abdominal binder for support.  If his pain intensifies or he begins running fevers he needs to go to the ER for evaluation.   Final Clinical Impressions(s) / UC Diagnoses   Final diagnoses:  Abdominal wall strain, initial encounter     Discharge Instructions      Your urinalysis and x-ray were unremarkable.  As we discussed, I do suspect that your pain is coming from the result of an abdominal muscle wall strain.  Purchase an abdominal binder from [employer], or medical supply store, and wear it to help provide support to your abdominal muscles and see if this helps alleviate some of your discomfort with movement.  You can take over-the-counter ibuprofen 600 mg (3 tablets), every 6 hours with food, as needed for pain and inflammation.  Avoid any heavy lifting, pulling, or straining.  If you have any increase in your abdominal pain, nausea vomiting, or you develop fever you need to go to the ER for evaluation.     ED Prescriptions   None    PDMP not reviewed this encounter.   Becky Augusta, NP 03/16/23 1625

## 2024-03-06 ENCOUNTER — Other Ambulatory Visit: Payer: Self-pay

## 2024-03-06 ENCOUNTER — Emergency Department

## 2024-03-06 ENCOUNTER — Emergency Department
Admission: EM | Admit: 2024-03-06 | Discharge: 2024-03-07 | Disposition: A | Discharge status: Home / Self Care | Attending: Emergency Medicine | Admitting: Emergency Medicine

## 2024-03-06 ENCOUNTER — Encounter: Payer: Self-pay | Admitting: *Deleted

## 2024-03-06 DIAGNOSIS — R079 Chest pain, unspecified: Secondary | ICD-10-CM | POA: Diagnosis present

## 2024-03-06 DIAGNOSIS — R42 Dizziness and giddiness: Secondary | ICD-10-CM | POA: Diagnosis not present

## 2024-03-06 LAB — BASIC METABOLIC PANEL WITH GFR
Anion gap: 11 (ref 5–15)
BUN: 15 mg/dL (ref 6–20)
CO2: 22 mmol/L (ref 22–32)
Calcium: 9.5 mg/dL (ref 8.9–10.3)
Chloride: 102 mmol/L (ref 98–111)
Creatinine, Ser: 1 mg/dL (ref 0.61–1.24)
GFR, Estimated: >60 mL/min (ref >60)
Glucose, Bld: 90 mg/dL (ref 70–99)
Potassium: 3.5 mmol/L (ref 3.5–5.1)
Sodium: 135 mmol/L (ref 135–145)

## 2024-03-06 LAB — CBC
HCT: 43.5 % (ref 39.0–52.0)
Hemoglobin: 15.1 g/dL (ref 13.0–17.0)
MCH: 31.2 pg (ref 26.0–34.0)
MCHC: 34.7 g/dL (ref 30.0–36.0)
MCV: 89.9 fL (ref 80.0–100.0)
Platelets: 281 10*3/uL (ref 150–400)
RBC: 4.84 MIL/uL (ref 4.22–5.81)
RDW: 12.4 % (ref 11.5–15.5)
WBC: 8.7 10*3/uL (ref 4.0–10.5)
nRBC: 0 % (ref 0.0–0.2)

## 2024-03-06 LAB — TROPONIN I (HIGH SENSITIVITY): Troponin I (High Sensitivity): <2 ng/L (ref <18)

## 2024-03-06 NOTE — ED Provider Notes (Signed)
 [facility] Provider Note    Event Date/Time   First MD Initiated Contact with Patient 03/06/24 2350     (approximate)   History   Chest Pain   HPI  Gilbert Hall is a 33 y.o. male presenting with chest pain and lightheadedness since 845 today.  He denies any nausea vomiting.  Was at work when symptoms began.  He does note some shortness of breath but no cough or fever.  No history of cardiac issues, he denies any hormone use, no history of blood clots, no recent travel or surgeries, no unilateral calf swelling, no hemoptysis, does not take any hormones, no history of malignancies.      Physical Exam   Triage Vital Signs: ED Triage Vitals  Encounter Vitals Group     BP 03/06/24 2223 (!) 155/86     Girls Systolic BP Percentile --      Girls Diastolic BP Percentile --      Boys Systolic BP Percentile --      Boys Diastolic BP Percentile --      Pulse Rate 03/06/24 2223 62     Resp 03/06/24 2223 20     Temp 03/06/24 2223 97.8 F (36.6 C)     Temp src --      SpO2 03/06/24 2223 97 %     Weight 03/06/24 2219 215 lb (97.5 kg)     Height 03/06/24 2219 5' 9 (1.753 m)     Head Circumference --      Peak Flow --      Pain Score 03/06/24 2219 6     Pain Loc --      Pain Education --      Exclude from Growth Chart --     Most recent vital signs: Vitals:   03/06/24 2223  BP: (!) 155/86  Pulse: 62  Resp: 20  Temp: 97.8 F (36.6 C)  SpO2: 97%     General: Awake, no distress.  CV:  Good peripheral perfusion.  Resp:  Normal effort.  Clear Abd:  No distention.  Soft nontender Other:  No unilateral calf swelling or tenderness   ED Results / Procedures / Treatments   Labs (all labs ordered are listed, but only abnormal results are displayed) Labs Reviewed  BASIC METABOLIC PANEL WITH GFR  CBC  TROPONIN I (HIGH SENSITIVITY)  TROPONIN I (HIGH SENSITIVITY)     EKG  EKG shows, sinus rhythm with sinus arrhythmia, rate 80, normal QRS,  normal QTc, no obvious ischemic ST elevation, T wave flattening in 3, no prior to compare   RADIOLOGY On my independent interpretation, chest x-ray without focal consolidation   PROCEDURES:  Critical Care performed: No  Procedures   MEDICATIONS ORDERED IN ED: Medications - No data to display   IMPRESSION / MDM / ASSESSMENT AND PLAN / ED COURSE  I reviewed the triage vital signs and the nursing notes.                              Differential diagnosis includes, but is not limited to, angina, ACS, anxiety, stress related symptoms, considered PE but he is PERC 0, consider pneumonia viral illness but he has no infectious symptoms.  Labs, EKG, troponin, chest x-ray, reassess.  P.o. hydration, reassess.  Patient's presentation is most consistent with acute presentation with potential threat to life or bodily function.  Independent interpretation of labs and imaging below.  Labs are reassuring, on reassessment, is not tachycardic, not hypoxic, considered but no indication for inpatient admission at this time, he is safe for outpatient management.  Shared decision making done with patient and he is agreeable with plan for discharge, will put in ED referral to primary care for him to follow-up outpatient.  Strict return precautions given.  Discharge.  The patient is on the cardiac monitor to evaluate for evidence of arrhythmia and/or significant heart rate changes.   Clinical Course as of 03/07/24 0042  Thu Mar 06, 2024  2352 DG Chest 1 View Low lung volumes.  Otherwise, no acute cardiopulmonary abnormality.  [TT]  2355 Independent review of labs, electrolytes not severely deranged, no leukocytosis, troponin is negative. [TT]  Fri Mar 07, 2024  0037 Troponin I (High Sensitivity) Troponin x 2 is negative. [TT]    Clinical Course User Index [TT] Waymond Lorelle Cummins, MD     FINAL CLINICAL IMPRESSION(S) / ED DIAGNOSES   Final diagnoses:  Chest pain, unspecified type     Rx / DC  Orders   ED Discharge Orders          Ordered    Ambulatory Referral to Primary Care (Establish Care)        03/07/24 0000             Note:  This document was prepared using Dragon voice recognition software and may include unintentional dictation errors.    Waymond Lorelle Cummins, MD 03/07/24 [phone]

## 2024-03-06 NOTE — ED Triage Notes (Signed)
 Pt ambulatory to triage.  Pt has chest pain and dizziness since 2045 tonight.  No n/v.  Pt was at work when [employer] began.  Pt also has osb.  No cough  non smoker  pt alert

## 2024-03-07 LAB — TROPONIN I (HIGH SENSITIVITY): Troponin I (High Sensitivity): <2 ng/L (ref <18)
